# Patient Record
Sex: Male | Born: 2000
Health system: Southern US, Community
[De-identification: ages and names within clinical notes are randomized; demographics above are authoritative.]

## PROBLEM LIST (undated history)

## (undated) DIAGNOSIS — R011 Cardiac murmur, unspecified: Secondary | ICD-10-CM

## (undated) HISTORY — PX: ADENOIDECTOMY: SUR15

## (undated) HISTORY — PX: TONSILLECTOMY: SUR1361

## (undated) HISTORY — PX: WISDOM TOOTH EXTRACTION: SHX21

---
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
## 2020-02-27 ENCOUNTER — Emergency Department (HOSPITAL_BASED_OUTPATIENT_CLINIC_OR_DEPARTMENT_OTHER)
Admission: EM | Admit: 2020-02-27 | Discharge: 2020-02-27 | Disposition: A | Discharge status: Home / Self Care | Payer: 59 | Attending: Emergency Medicine | Admitting: Emergency Medicine

## 2020-02-27 ENCOUNTER — Emergency Department (HOSPITAL_BASED_OUTPATIENT_CLINIC_OR_DEPARTMENT_OTHER): Payer: 59

## 2020-02-27 ENCOUNTER — Encounter (HOSPITAL_BASED_OUTPATIENT_CLINIC_OR_DEPARTMENT_OTHER): Payer: Self-pay | Admitting: Emergency Medicine

## 2020-02-27 ENCOUNTER — Other Ambulatory Visit: Payer: Self-pay

## 2020-02-27 DIAGNOSIS — R072 Precordial pain: Secondary | ICD-10-CM | POA: Diagnosis not present

## 2020-02-27 DIAGNOSIS — M25511 Pain in right shoulder: Secondary | ICD-10-CM | POA: Diagnosis not present

## 2020-02-27 DIAGNOSIS — R0602 Shortness of breath: Secondary | ICD-10-CM | POA: Insufficient documentation

## 2020-02-27 DIAGNOSIS — M545 Low back pain: Secondary | ICD-10-CM | POA: Diagnosis not present

## 2020-02-27 DIAGNOSIS — Z87891 Personal history of nicotine dependence: Secondary | ICD-10-CM | POA: Insufficient documentation

## 2020-02-27 LAB — BASIC METABOLIC PANEL
Anion gap: 12 (ref 5–15)
BUN: 13 mg/dL (ref 6–20)
CO2: 23 mmol/L (ref 22–32)
Calcium: 9.2 mg/dL (ref 8.9–10.3)
Chloride: 102 mmol/L (ref 98–111)
Creatinine, Ser: 0.76 mg/dL (ref 0.61–1.24)
GFR calc Af Amer: >60 mL/min (ref >60)
GFR calc non Af Amer: >60 mL/min (ref >60)
Glucose, Bld: 83 mg/dL (ref 70–99)
Potassium: 3.8 mmol/L (ref 3.5–5.1)
Sodium: 137 mmol/L (ref 135–145)

## 2020-02-27 LAB — CBC
HCT: 44.9 % (ref 39.0–52.0)
Hemoglobin: 15 g/dL (ref 13.0–17.0)
MCH: 29.9 pg (ref 26.0–34.0)
MCHC: 33.4 g/dL (ref 30.0–36.0)
MCV: 89.6 fL (ref 80.0–100.0)
Platelets: 147 10*3/uL — ABNORMAL LOW (ref 150–400)
RBC: 5.01 MIL/uL (ref 4.22–5.81)
RDW: 12.5 % (ref 11.5–15.5)
WBC: 6.7 10*3/uL (ref 4.0–10.5)
nRBC: 0 % (ref 0.0–0.2)

## 2020-02-27 LAB — D-DIMER, QUANTITATIVE: D-Dimer, Quant: 0.78 ug/mL-FEU — ABNORMAL HIGH (ref 0.00–0.50)

## 2020-02-27 LAB — TROPONIN I (HIGH SENSITIVITY): Troponin I (High Sensitivity): 3 ng/L (ref <18)

## 2020-02-27 MED ORDER — IOHEXOL 350 MG/ML SOLN
100.0000 mL | Freq: Once | INTRAVENOUS | Status: AC | PRN
  Administered 2020-02-27: 100 mL via INTRAVENOUS

## 2020-02-27 MED ORDER — AZITHROMYCIN 250 MG PO TABS: 250.0000 mg | ORAL_TABLET | Freq: Every day | ORAL | 0 refills | Status: DC

## 2020-02-27 MED FILL — AZITHROMYCIN 250 MG TABLET: 250 | 5 days supply | Qty: 6 | Fill #0

## 2020-02-27 NOTE — ED Notes (Signed)
ED Provider at bedside. 

## 2020-02-27 NOTE — Discharge Instructions (Signed)
Work-up for the chest pain without any significant findings.  CT chest no signs of blood clots.  Cardiac heart marker was normal.  CT chest did raise maybe is suspicion of an early infiltrate.  So based on that we will give you a prescription for Zithromax just in case.  Do make an appointment to follow-up with cardiology.  Return for any new or worse symptoms.

## 2020-02-27 NOTE — ED Triage Notes (Addendum)
Central chest pain since yesterday. Pain is worse with inspiration. Hx of pericarditis.

## 2020-02-27 NOTE — ED Provider Notes (Signed)
[facility] EMERGENCY DEPARTMENT Provider Note   CSN: [id number] Arrival date & time: 02/27/20  1037     History Chief Complaint  Patient presents with  . Chest Pain    Paul Navarro is a 19 y.o. male.  Patient with onset of central sternal area chest pain yesterday.  Worse with inspiration.  Pain is been persistent since it started.  Patient with a past history of pericarditis seen by [facility] cardiology at that time.  Appears this was back in September 2019.  Patient also around that time was diagnosed with the community-acquired pneumonia.  So he was concerned about either 1 of these symptoms developing.  Has been associated with some shortness of breath.  Denies any fevers.  Or any upper respiratory symptoms.  No leg swelling.        Past Medical History:  Diagnosis Date  . Heart murmur     There are no problems to display for this patient.   Past Surgical History:  Procedure Laterality Date  . ADENOIDECTOMY    . TONSILLECTOMY    . WISDOM TOOTH EXTRACTION         No family history on file.  Social History   Tobacco Use  . Smoking status: Former Games developer  . Smokeless tobacco: Never Used  Vaping Use  . Vaping Use: Never used  Substance Use Topics  . Alcohol use: Yes  . Drug use: Never    Home Medications Prior to Admission medications   Medication Sig Start Date End Date Taking? Authorizing Provider  azithromycin (ZITHROMAX) 250 MG tablet Take 1 tablet (250 mg total) by mouth daily. Take first 2 tablets together, then 1 every day until finished. 02/27/20   Vanetta Mulders, MD    Allergies    Patient has no known allergies.  Review of Systems   Review of Systems  Constitutional: Negative for chills and fever.  HENT: Negative for rhinorrhea and sore throat.   Eyes: Negative for visual disturbance.  Respiratory: Positive for shortness of breath. Negative for cough.   Cardiovascular: Positive for chest pain. Negative for leg swelling.    Gastrointestinal: Negative for abdominal pain, diarrhea, nausea and vomiting.  Genitourinary: Negative for dysuria.  Musculoskeletal: Negative for back pain and neck pain.  Skin: Negative for rash.  Neurological: Negative for dizziness, light-headedness and headaches.  Hematological: Does not bruise/bleed easily.  Psychiatric/Behavioral: Negative for confusion.    Physical Exam Updated Vital Signs BP 136/71 Comment: room air  Pulse 94 Comment: room air  Temp 98.2 F (36.8 C) (Oral)   Resp (!) 22 Comment: room air  Ht 1.88 m (6\' 2" )   Wt 97.5 kg   SpO2 100% Comment: room air  BMI 27.60 kg/m   Physical Exam Vitals and nursing note reviewed.  Constitutional:      Appearance: Normal appearance. He is well-developed.  HENT:     Head: Normocephalic and atraumatic.  Eyes:     Extraocular Movements: Extraocular movements intact.     Conjunctiva/sclera: Conjunctivae normal.     Pupils: Pupils are equal, round, and reactive to light.  Cardiovascular:     Rate and Rhythm: Normal rate and regular rhythm.     Pulses: Normal pulses.     Heart sounds: Normal heart sounds. No murmur heard.   Pulmonary:     Effort: Pulmonary effort is normal. No respiratory distress.     Breath sounds: Normal breath sounds.  Abdominal:     Palpations: Abdomen is soft.  Tenderness: There is no abdominal tenderness.  Musculoskeletal:        General: No swelling. Normal range of motion.     Cervical back: Normal range of motion and neck supple.  Skin:    General: Skin is warm and dry.  Neurological:     General: No focal deficit present.     Mental Status: He is alert and oriented to person, place, and time.     Cranial Nerves: No cranial nerve deficit.     Sensory: No sensory deficit.     Motor: No weakness.     ED Results / Procedures / Treatments   Labs (all labs ordered are listed, but only abnormal results are displayed) Labs Reviewed  CBC - Abnormal; Notable for the following  components:      Result Value   Platelets 147 (*)    All other components within normal limits  D-DIMER, QUANTITATIVE (NOT AT [facility]) - Abnormal; Notable for the following components:   D-Dimer, Quant 0.78 (*)    All other components within normal limits  BASIC METABOLIC PANEL  TROPONIN I (HIGH SENSITIVITY)    EKG EKG Interpretation  Date/Time:  Thursday February 27 2020 10:43:06 EDT Ventricular Rate:  107 PR Interval:  120 QRS Duration: 88 QT Interval:  298 QTC Calculation: 397 R Axis:   60 Text Interpretation: Sinus tachycardia Nonspecific ST abnormality Abnormal ECG Confirmed by Vanetta Mulders [phone]) on 02/27/2020 11:24:16 AM   Radiology DG Chest 2 View  Result Date: 02/27/2020 CLINICAL DATA:  Chest pain. EXAM: CHEST - 2 VIEW COMPARISON:  May 21, 2018. FINDINGS: The heart size and mediastinal contours are within normal limits. Both lungs are clear. No pneumothorax or pleural effusion is noted. The visualized skeletal structures are unremarkable. IMPRESSION: No active cardiopulmonary disease. Electronically Signed   By: Lupita Raider M.D.   On: 02/27/2020 11:20   CT Angio Chest PE W/Cm &/Or Wo Cm  Result Date: 02/27/2020 CLINICAL DATA:  Chest pain. EXAM: CT ANGIOGRAPHY CHEST WITH CONTRAST TECHNIQUE: Multidetector CT imaging of the chest was performed using the standard protocol during bolus administration of intravenous contrast. Multiplanar CT image reconstructions and MIPs were obtained to evaluate the vascular anatomy. CONTRAST:  OMNIPAQUE IOHEXOL 350 MG/ML SOLN COMPARISON:  None. FINDINGS: Cardiovascular: Satisfactory opacification of the pulmonary arteries to the segmental level. No evidence of pulmonary embolism. Normal heart size. No pericardial effusion. Mediastinum/Nodes: No enlarged mediastinal, hilar, or axillary lymph nodes. Thyroid gland, trachea, and esophagus demonstrate no significant findings. Lungs/Pleura: No pneumothorax or pleural effusion is noted. Right  lung is clear. Mild left posterior basilar subsegmental atelectasis or infiltrate is noted. Upper Abdomen: No acute abnormality. Musculoskeletal: No chest wall abnormality. No acute or significant osseous findings. Review of the MIP images confirms the above findings. IMPRESSION: 1. No definite evidence of pulmonary embolus. 2. Mild left posterior basilar subsegmental atelectasis or infiltrate is noted. Electronically Signed   By: Lupita Raider M.D.   On: 02/27/2020 12:56    Procedures Procedures (including critical care time)  Medications Ordered in ED Medications  iohexol (OMNIPAQUE) 350 MG/ML injection 100 mL (100 mLs Intravenous Contrast Given 02/27/20 1214)    ED Course  I have reviewed the triage vital signs and the nursing notes.  Pertinent labs & imaging results that were available during my care of the patient were reviewed by me and considered in my medical decision making (see chart for details).    MDM Rules/Calculators/A&P  Work-up troponin normal.  Do not feel need for delta troponin since his pain started yesterday and has been constant.  Chest x-ray negative D-dimer elevated so CT angio was done.  CT angio without evidence of pulmonary embolus.  No evidence of any para or cardial fluid.  CT angio did raise concern about a basilar early infiltrate or atelectasis.  Since patient was concerned about possible pneumonia although no leukocytosis no fevers.  Will treat with a Z-Pak just in case it is an early pneumonia.  Do not think it will be harmful.  Recommend follow-up with cardiology again.  And returning for any new or worse symptoms.  EKG without evidence of ST segment elevation.  May be a hint of some septal ST segment depression.   Electrolytes without any significant abnormality. Final Clinical Impression(s) / ED Diagnoses Final diagnoses:  Precordial pain    Rx / DC Orders ED Discharge Orders         Ordered    azithromycin  (ZITHROMAX) 250 MG tablet  Daily     Discontinue  Reprint     02/27/20 1338           Vanetta Mulders, MD 02/27/20 1433

## 2020-02-28 DIAGNOSIS — R0602 Shortness of breath: Secondary | ICD-10-CM | POA: Diagnosis not present

## 2020-02-28 DIAGNOSIS — R072 Precordial pain: Secondary | ICD-10-CM | POA: Diagnosis not present

## 2020-02-28 DIAGNOSIS — M545 Low back pain: Secondary | ICD-10-CM | POA: Diagnosis not present

## 2020-02-28 DIAGNOSIS — M25511 Pain in right shoulder: Secondary | ICD-10-CM | POA: Diagnosis not present

## 2020-03-03 ENCOUNTER — Telehealth: Payer: Self-pay | Admitting: Cardiology

## 2020-03-03 NOTE — Telephone Encounter (Signed)
Pt and his Mother are calling to ask Dr. Anne Fu and his RN if they could facilitate moving his appt up to a sooner date, for post ER follow-up. Pt has an appt scheduled with Dr. Anne Fu for 7/30.  Both parties state the pt went to the ER on 7/1 and 7/2 for complaints of chest pain.  Labs were drawn and showed elevated D-DIMER.  CT Angio was then ordered and complete, and revealed NO evidence of PE. CT Angio did raise concern about basilar early infiltrate or atelectasis.  Noted by ER MD, since the pt was concerned about possible pneumonia, they treated him with a Z-PAK just incase it is an early pneumonia.  Pt also had a chest x-ray done which showed both lungs were clear and no pneumothorax or pleural effusion, other findings were within normal limits.  Pt also had an EKG done, and mentioned that may have a hint of some septal ST segment depression.   Pts Mother states they were going to do an echo on him, but were unable to obtain this appt.  Pt states he just finished his last dose of Z-PAK.  Both the pt and mother are concerned that his pericarditis could be returning, for he still gets winded on exertion, and he is having the same symptoms come on, that he once had back when he had pericarditis some time ago.  Both parties are wanting Dr. Anne Fu input on this matter, and they would like for them to move the pts appt up to a sooner date, for they state 7/30 is too long to wait.  Pt states he does have an appt with his PCP on this Thursday 7/8, for hospital follow-up.  Advised the pt to keep his appt with his PCP.  Advised him to report to the ER if symptoms persist or worsen.  Pt education provided on what warrants immediate medical attention.  Informed the pt that both Dr. Anne Fu and his RN are out of the office today, but I will route this message to them for further review and recommendation, and to follow-up with the pt thereafter.  Both the pt and his mother verbalized understanding and agrees  with this plan.

## 2020-03-03 NOTE — Telephone Encounter (Signed)
Patient's mother calling requesting the patient be seen sooner than his appointment 03/27/2020. I did not see any sooner appointments, but she would like to know if the patient can be worked in sooner.

## 2020-03-04 NOTE — Telephone Encounter (Signed)
Okay to place him on July 12 at 10:40 AM. Thanks Donato Schultz, MD

## 2020-03-04 NOTE — Telephone Encounter (Signed)
I spoke with patient and scheduled him to see Dr Anne Fu on July 12 at 10:40

## 2020-03-09 ENCOUNTER — Encounter: Payer: Self-pay | Admitting: *Deleted

## 2020-03-09 ENCOUNTER — Other Ambulatory Visit: Payer: Self-pay

## 2020-03-09 ENCOUNTER — Encounter: Payer: Self-pay | Admitting: Cardiology

## 2020-03-09 ENCOUNTER — Ambulatory Visit (INDEPENDENT_AMBULATORY_CARE_PROVIDER_SITE_OTHER): Payer: 59 | Admitting: Cardiology

## 2020-03-09 VITALS — BP 112/60 | HR 92 | Ht 74.0 in | Wt 214.0 lb

## 2020-03-09 DIAGNOSIS — R079 Chest pain, unspecified: Secondary | ICD-10-CM | POA: Diagnosis not present

## 2020-03-09 DIAGNOSIS — I309 Acute pericarditis, unspecified: Secondary | ICD-10-CM

## 2020-03-09 MED ORDER — COLCHICINE 0.6 MG PO TABS: 0.6000 mg | ORAL_TABLET | Freq: Two times a day (BID) | ORAL | 2 refills | Status: DC

## 2020-03-09 NOTE — Progress Notes (Signed)
Cardiology Office Note:    Date:  03/09/2020   ID:  Paul Navarro, DOB November 23, 2000, MRN [id number]  PCP:  Robert Bellow, PA-C  [facility] Cardiologist:  Candee Furbish, MD  [facility] Electrophysiologist:  None   Referring MD: Jefm Petty, MD     History of Present Illness:    Paul Navarro is a 19 y.o. male with recurrent chest pain of pericarditis in the past.  Was seen in the emergency department on 02/28/2020 at outside hospital.  Had a negative CTA of chest.  No evidence of pericardial effusion.  On 7/20 had sternal chest pain elevated D-dimer CT scan showed mild left posterior basilar subsegmental atelectasis/infiltrate and was treated with Z-Pak.  No pericardial fluid.  The next day came back to the ER with continued chest pain and was discharged with reassurance.  He was feeling some shortness of breath especially with bending over.  When he saw internal medicine at [facility] on 03/05/2020 he was feeling better pain decreased from 10/10 down to 1 over 2/10 in review of Paul Navarro, [place] note  Ibuprofen 800 mg 3 times a day was given for 10 days.  CRP was drawn as well as ESR.  CRP was elevated at 73 and sed rate was 69 also elevated.  Colchicine seen was added.  0.6 mg twice daily (for 10 days)  Overall he is feeling well today.  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  Colchicine needed a preauthorization.    Past Medical History:  Diagnosis Date  . Heart murmur     Past Surgical History:  Procedure Laterality Date  . ADENOIDECTOMY    . TONSILLECTOMY    . WISDOM TOOTH EXTRACTION      Current Medications: Current Meds  Medication Sig  . colchicine 0.6 MG tablet Take 1 tablet (0.6 mg total) by mouth 2 (two) times daily. Take 1 tablet twice a day for 3 months  .  [DISCONTINUED] colchicine 0.6 MG tablet Take 0.6 mg by mouth daily.     Allergies:   Patient has no known allergies.   Social History   Socioeconomic History  . Marital status: Single    Spouse name: Not on file  . Number of children: Not on file  . Years of education: Not on file  . Highest education level: Not on file  Occupational History  . Not on file  Tobacco Use  . Smoking status: Former Research scientist (life sciences)  . Smokeless tobacco: Never Used  Vaping Use  . Vaping Use: Never used  Substance and Sexual Activity  . Alcohol use: Yes  . Drug use: Never  . Sexual activity: Not on file  Other Topics Concern  . Not on file  Social History Narrative  . Not on file   Social Determinants of Health   Financial Resource Strain:   . Difficulty of Paying Living Expenses:   Food Insecurity:   . Worried About Charity fundraiser in the Last Year:   . Arboriculturist in the Last Year:   Transportation Needs:   . Film/video editor (Medical):   Marland Kitchen Lack of Transportation (Non-Medical):   Physical Activity:   . Days of Exercise per Week:   . Minutes of Exercise per Session:   Stress:   .  Feeling of Stress :   Social Connections:   . Frequency of Communication with Friends and Family:   . Frequency of Social Gatherings with Friends and Family:   . Attends Religious Services:   . Active Member of Clubs or Organizations:   . Attends Archivist Meetings:   Marland Kitchen Marital Status:      Family History: The patient's father has had "blood clot in his heart "is on blood thinner, also needed a vein in his leg removed and placed in his heart "-likely bypass surgery.  ROS:   Please see the history of present illness.     All other systems reviewed and are negative.  EKGs/Labs/Other Studies Reviewed:     EKG: EKG from 02/28/2020 reviewed shows sinus rhythm with nonspecific ST-T wave changes  Recent Labs: 02/27/2020: BUN 13; Creatinine, Ser 0.76; Hemoglobin 15.0; Platelets 147; Potassium  3.8; Sodium 137  Recent Lipid Panel No results found for: CHOL, TRIG, HDL, CHOLHDL, VLDL, LDLCALC, LDLDIRECT  Physical Exam:    VS:  BP 112/60   Pulse 92   Ht '6\' 2"'$  (1.88 m)   Wt 214 lb (97.1 kg)   SpO2 99%   BMI 27.48 kg/m     Wt Readings from Last 3 Encounters:  03/09/20 214 lb (97.1 kg) (96 %, Z= 1.73)*  02/27/20 215 lb (97.5 kg) (96 %, Z= 1.76)*  06/25/18 202 lb (91.6 kg) (95 %, Z= 1.62)*   * Growth percentiles are based on CDC (Boys, 2-20 Years) data.     GEN:  Well nourished, well developed in no acute distress HEENT: Normal NECK: No JVD; No carotid bruits LYMPHATICS: No lymphadenopathy CARDIAC: RRR, no murmurs, rubs, gallops RESPIRATORY:  Clear to auscultation without rales, wheezing or rhonchi  ABDOMEN: Soft, non-tender, non-distended MUSCULOSKELETAL:  No edema; No deformity  SKIN: Warm and dry NEUROLOGIC:  Alert and oriented x 3 PSYCHIATRIC:  Normal affect   ASSESSMENT:    1. Acute pericarditis, unspecified type   2. Chest pain of uncertain etiology    PLAN:    In order of problems listed above:  Second episode of pericarditis.  Last episode was about 2 years ago. We will give him colchicine for 3 months total. Spoke to them about Arcalyst, Rilonacept which can be used for recurrent pericarditis.  New medication.  He is feeling better now.  Inflammatory markers, CRP were elevated. 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  If he is feeling well in 2 weeks, he may resume this activity. I would hold off on any heavy exertional activity at this time. ANA has been drawn by primary doc.  Father has had clot.  Hopefully there is no evidence of hypercoagulable state.  I am checking echocardiogram.  Lets make sure that there is no decrease in function.  Medication Adjustments/Labs and Tests Ordered: Current medicines are reviewed at length with the patient today.  Concerns regarding medicines are outlined above.  Orders Placed This  Encounter  Procedures  . ECHOCARDIOGRAM COMPLETE   Meds ordered this encounter  Medications  . colchicine 0.6 MG tablet    Sig: Take 1 tablet (0.6 mg total) by mouth 2 (two) times daily. Take 1 tablet twice a day for 3 months    Dispense:  60 tablet    Refill:  2    Patient Instructions  Medication Instructions:  Please take Colchicine 0.6 (1) tablet twice a day for 3 months. Continue all other medications as listed.  *  If you need a refill on your cardiac medications before your next appointment, please call your pharmacy*  Testing/Procedures: Your physician has requested that you have an echocardiogram. Echocardiography is a painless test that uses sound waves to create images of your heart. It provides your doctor with information about the size and shape of your heart and how well your heart's chambers and valves are working. This procedure takes approximately one hour. There are no restrictions for this procedure.  Follow-Up: At [facility], you and your health needs are our priority.  As part of our continuing mission to provide you with exceptional heart care, we have created designated Provider Care Teams.  These Care Teams include your primary Cardiologist (physician) and Advanced Practice Providers (APPs -  Physician Assistants and Nurse Practitioners) who all work together to provide you with the care you need, when you need it.  We recommend signing up for the patient portal called "MyChart".  Sign up information is provided on this After Visit Summary.  MyChart is used to connect with patients for Virtual Visits (Telemedicine).  Patients are able to view lab/test results, encounter notes, upcoming appointments, etc.  Non-urgent messages can be sent to your provider as well.   To learn more about what you can do with MyChart, go to [email or web address].    Your next appointment:   2 month(s)  The format for your next appointment:   In Person  Provider:   You may  see one of the following Advanced Practice Providers on your designated Care Team:    Truitt Merle, NP  Cecilie Kicks, NP  Kathyrn Drown, NP   Thank you for choosing [facility]!!         Signed, Candee Furbish, MD  03/09/2020 11:39 AM    [place]

## 2020-03-09 NOTE — Patient Instructions (Addendum)
Medication Instructions:  Please take Colchicine 0.6 (1) tablet twice a day for 3 months. Continue all other medications as listed.  *If you need a refill on your cardiac medications before your next appointment, please call your pharmacy*  Testing/Procedures: Your physician has requested that you have an echocardiogram. Echocardiography is a painless test that uses sound waves to create images of your heart. It provides your doctor with information about the size and shape of your heart and how well your heart's chambers and valves are working. This procedure takes approximately one hour. There are no restrictions for this procedure.  Follow-Up: At [facility], you and your health needs are our priority.  As part of our continuing mission to provide you with exceptional heart care, we have created designated Provider Care Teams.  These Care Teams include your primary Cardiologist (physician) and Advanced Practice Providers (APPs -  Physician Assistants and Nurse Practitioners) who all work together to provide you with the care you need, when you need it.  We recommend signing up for the patient portal called "MyChart".  Sign up information is provided on this After Visit Summary.  MyChart is used to connect with patients for Virtual Visits (Telemedicine).  Patients are able to view lab/test results, encounter notes, upcoming appointments, etc.  Non-urgent messages can be sent to your provider as well.   To learn more about what you can do with MyChart, go to [email or web address].    Your next appointment:   2 month(s)  The format for your next appointment:   In Person  Provider:   You may see one of the following Advanced Practice Providers on your designated Care Team:    Norma Fredrickson, NP  Nada Boozer, NP  Georgie Chard, NP   Thank you for choosing [facility]!!

## 2020-03-18 MED ORDER — COLCHICINE 0.6 MG PO CAPS: 0.6000 mg | ORAL_CAPSULE | Freq: Two times a day (BID) | ORAL | 0 refills | Status: AC

## 2020-03-18 MED ORDER — COLCHICINE 0.6 MG PO TABS: 0.6000 mg | ORAL_TABLET | Freq: Two times a day (BID) | ORAL | 2 refills | Status: DC

## 2020-03-18 NOTE — Addendum Note (Signed)
Addended by: Sharin Grave on: 03/18/2020 02:29 PM   Modules accepted: Orders

## 2020-03-24 ENCOUNTER — Ambulatory Visit (HOSPITAL_COMMUNITY): Payer: 59 | Attending: Cardiology

## 2020-03-24 ENCOUNTER — Other Ambulatory Visit: Payer: Self-pay

## 2020-03-24 DIAGNOSIS — I309 Acute pericarditis, unspecified: Secondary | ICD-10-CM

## 2020-03-24 LAB — ECHOCARDIOGRAM COMPLETE
Area-P 1/2: 3.51 cm2
S' Lateral: 2.6 cm

## 2020-03-25 ENCOUNTER — Telehealth: Payer: Self-pay | Admitting: Cardiology

## 2020-03-25 NOTE — Telephone Encounter (Signed)
Transferred call to Ann 

## 2020-03-27 ENCOUNTER — Ambulatory Visit: Payer: 59 | Admitting: Cardiology

## 2020-04-05 IMAGING — CT CT ABD-PELV W/ CM
2 of 4 series · 16 of 46 positions shown, 18 images · IV contrast (APPLIED)
Comparison: None.

CLINICAL DATA: 16-year-old male with acute RIGHT abdominal and
pelvic pain with fever for 1 day.

EXAM:
CT ABDOMEN AND PELVIS WITH CONTRAST
TECHNIQUE: Multidetector CT imaging of the abdomen and pelvis was performed
using the standard protocol following bolus administration of
intravenous contrast.
CONTRAST:  100mL A6OXMP-DPP IOPAMIDOL (A6OXMP-DPP) INJECTION 61%

[Series 2: axial st · axial · 0.80mm/px · z∈[+693,+1208]mm · 13 of 113 slices shown, 15 images]
[im 5/113  soft-tissue]
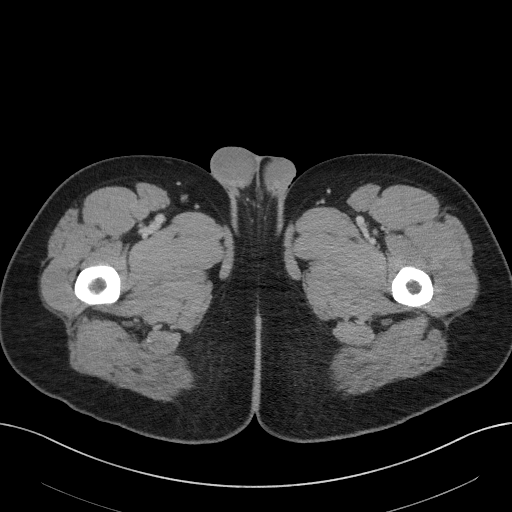
[im 5/113  bone]
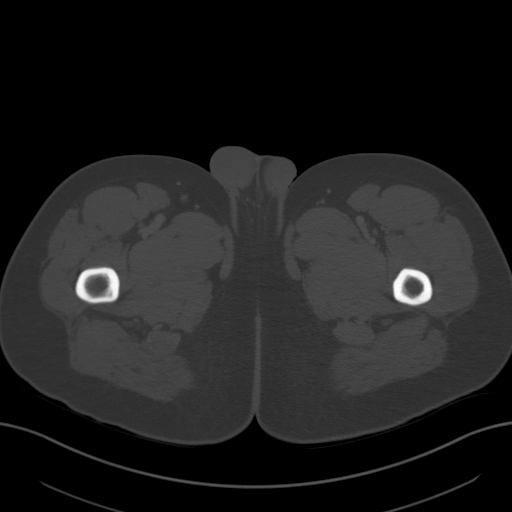
[im 14/113  soft-tissue]
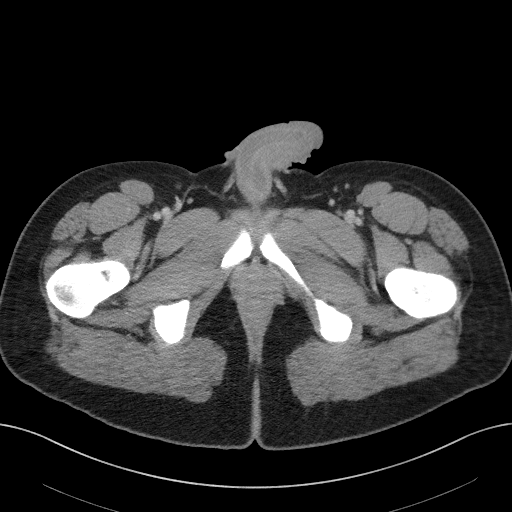
[im 23/113  soft-tissue]
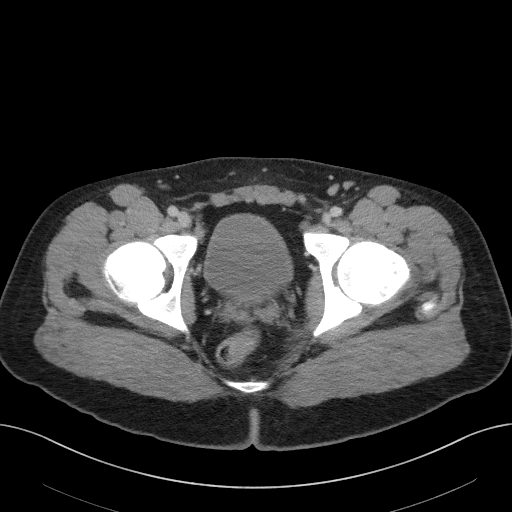
[im 32/113  soft-tissue]
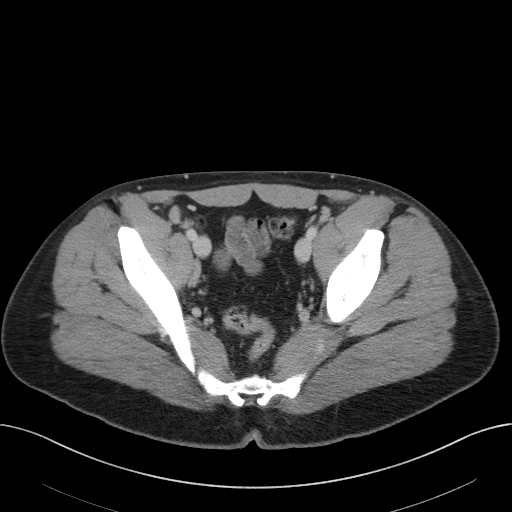
[im 41/113  soft-tissue]
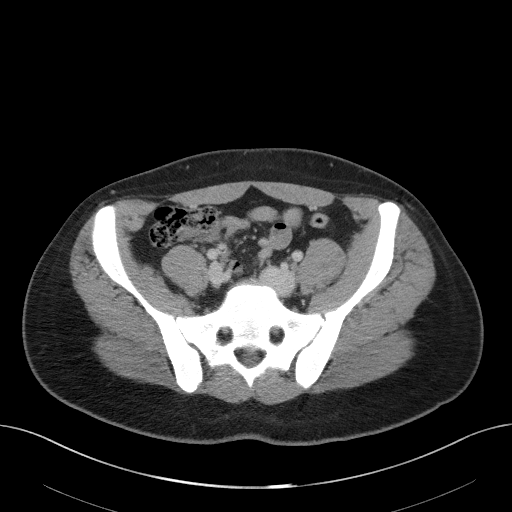
[im 50/113  soft-tissue]
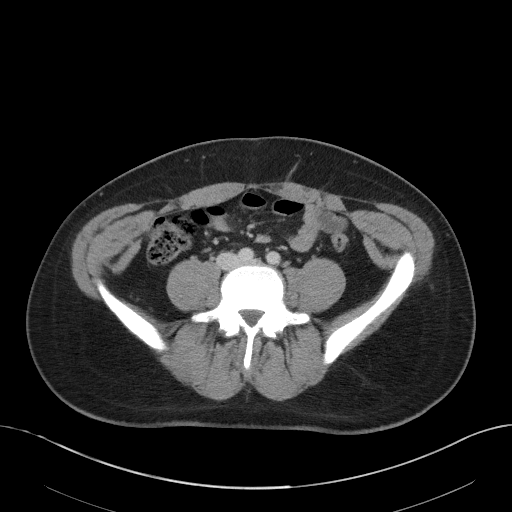
[im 59/113  soft-tissue]
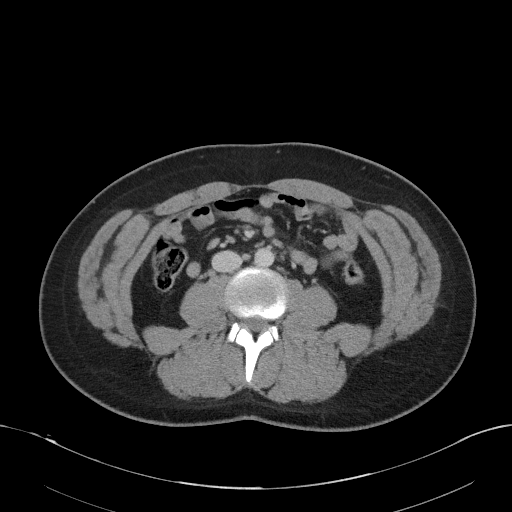
[im 63/113  soft-tissue]
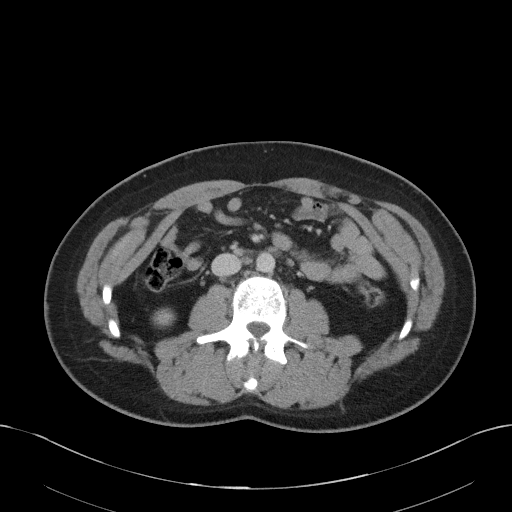
[im 72/113  soft-tissue]
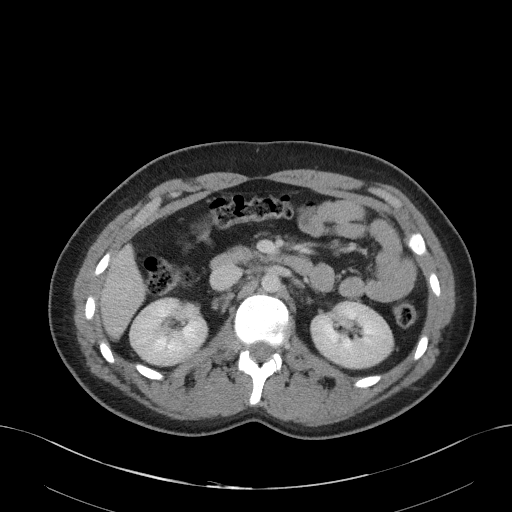
[im 72/113  bone]
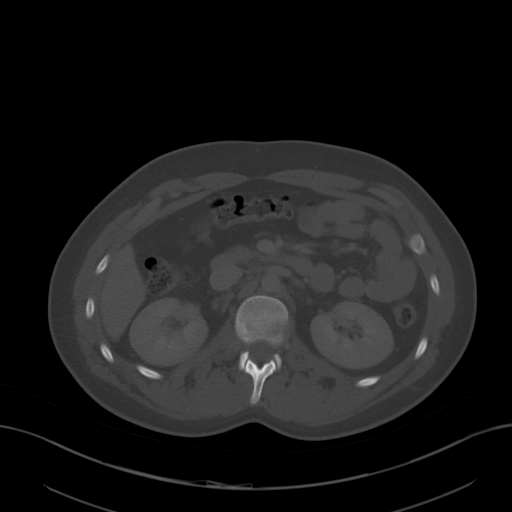
[im 81/113  soft-tissue]
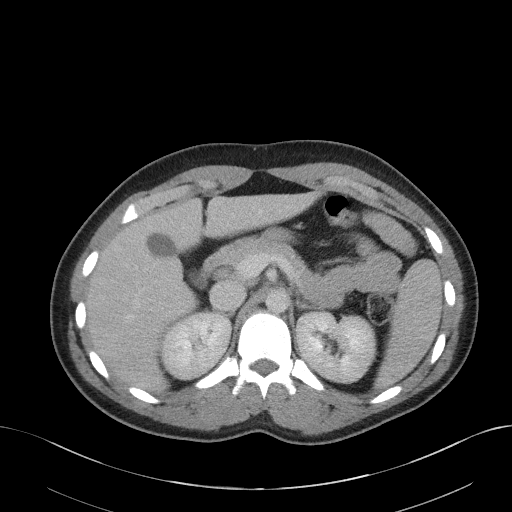
[im 90/113  soft-tissue]
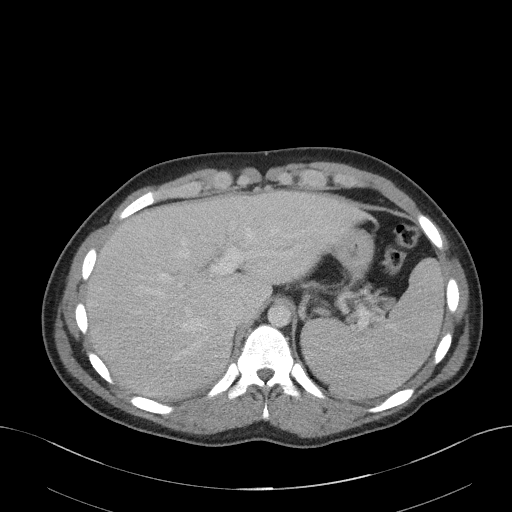
[im 99/113  soft-tissue]
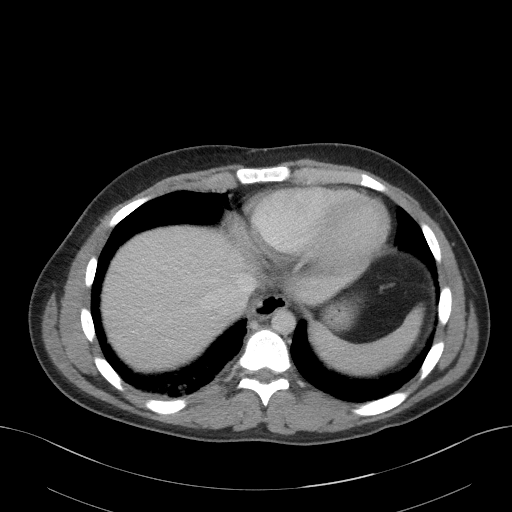
[im 108/113  soft-tissue]
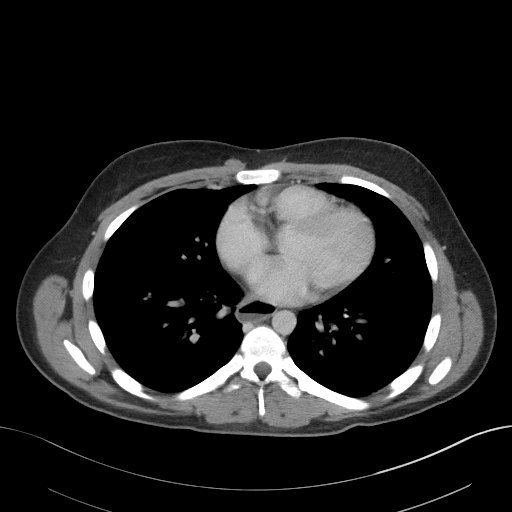

[Series 5: coronal st · coronal · 0.80mm/px · 3 of 76 slices shown]
[im 26/76  soft-tissue]
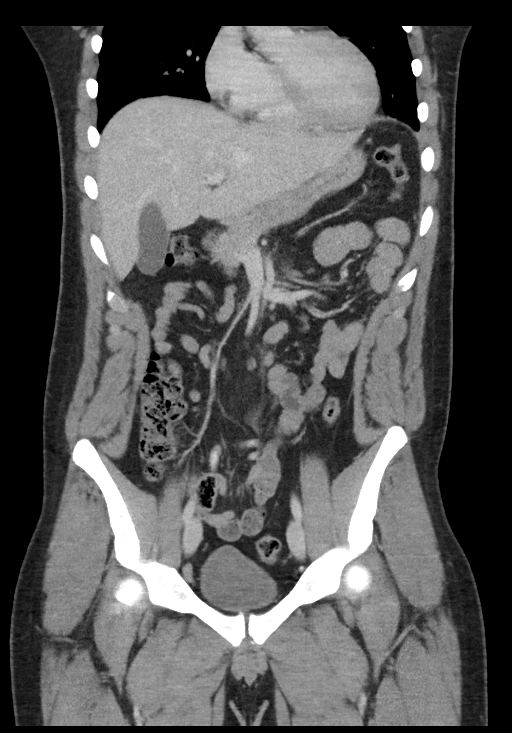
[im 34/76  soft-tissue]
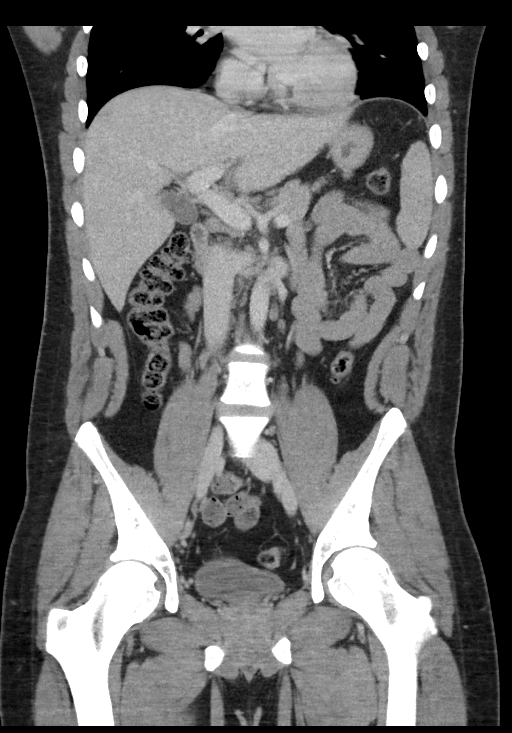
[im 42/76  soft-tissue]
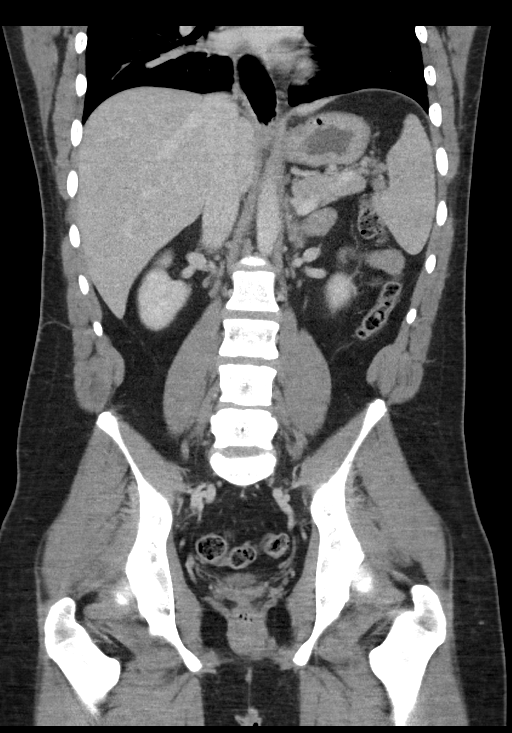

[16 of 46 positions shown; findings below may reference images not displayed]

FINDINGS: Lower chest: Mild posterior RIGHT LOWER lobe airspace disease likely
represents pneumonia.

Hepatobiliary: The liver and gallbladder are unremarkable. No
biliary dilatation.

Pancreas: Unremarkable

Spleen: Unremarkable

Adrenals/Urinary Tract: The kidneys, adrenal glands and bladder are
unremarkable.

Stomach/Bowel: Stomach is within normal limits. Appendix appears
normal. No evidence of bowel wall thickening, distention, or
inflammatory changes.

Vascular/Lymphatic: Shotty retroperitoneal lymph nodes are likely
reactive. No vascular abnormalities identified.

Reproductive: Prostate is unremarkable.

Other: No ascites, abscess, pneumoperitoneum or abdominal wall
hernia.

Musculoskeletal: No acute or suspicious bony abnormalities.
IMPRESSION: 1. Mild RIGHT LOWER lobe airspace disease likely representing
pneumonia.
2. Shotty retroperitoneal lymph nodes-likely reactive.
3. No other significant abnormalities.  Normal appendix.

## 2020-04-06 IMAGING — CR DG CHEST 2V
2 series · 2 of 2 positions shown · non-contrast
Comparison: None.

CLINICAL DATA: Shortness of breath and fever

EXAM:
CHEST - 2 VIEW

[w chest pa]
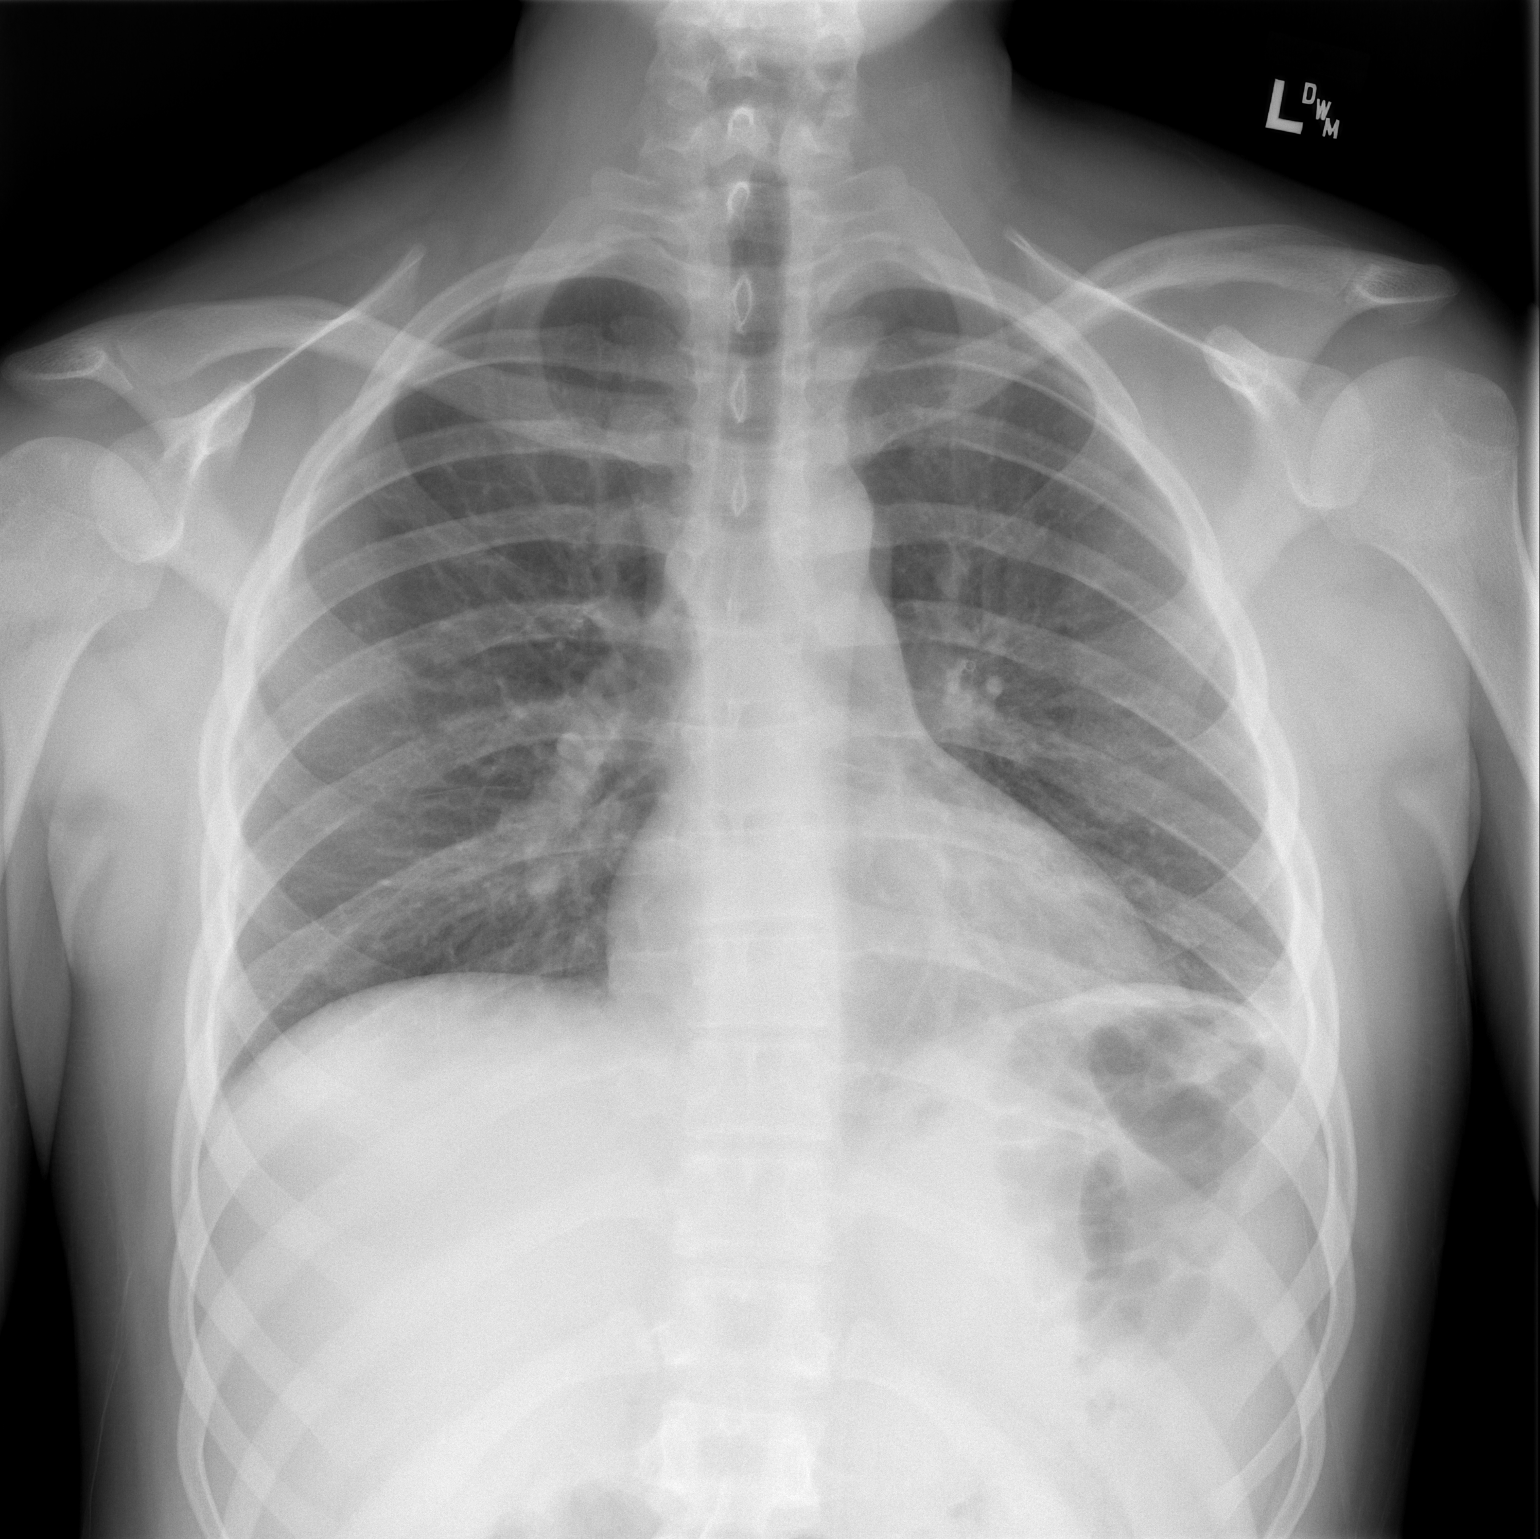

[w chest lat]
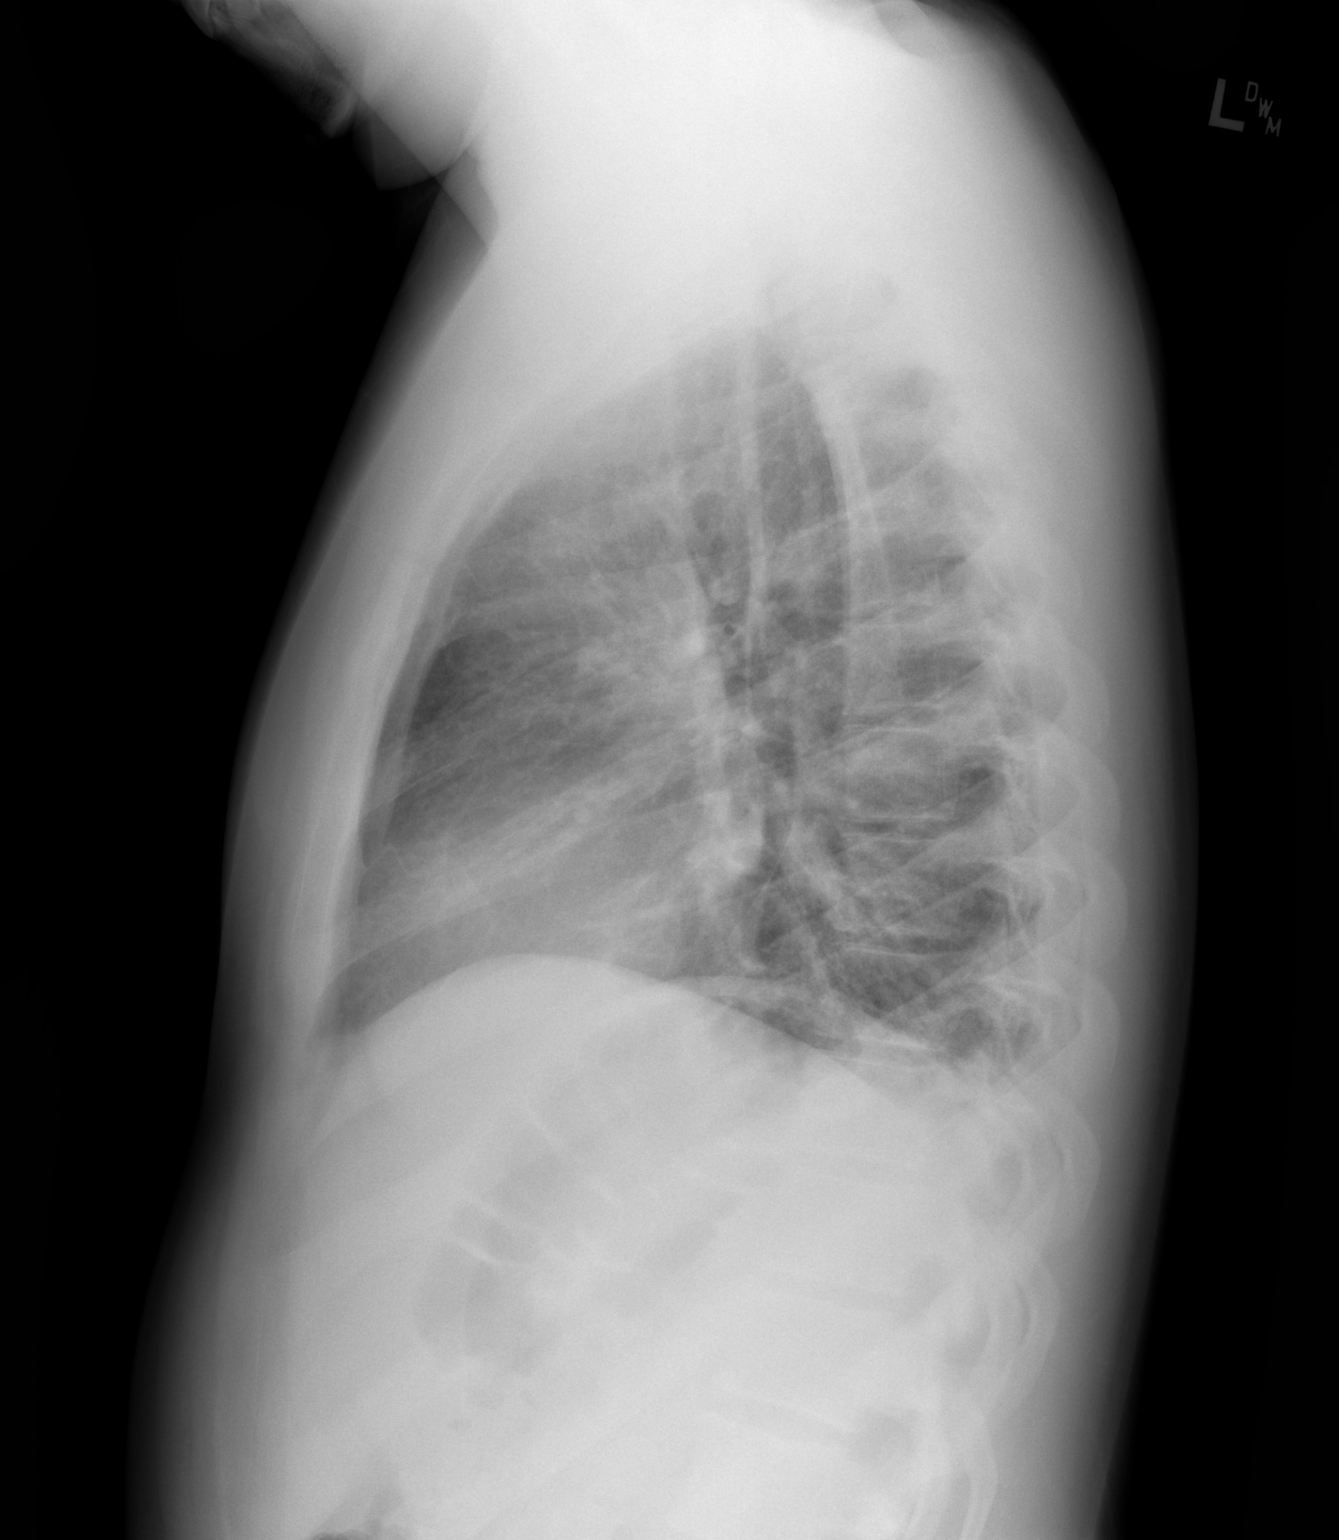

[2 of 2 positions shown; findings below may reference images not displayed]

FINDINGS: There is slight atelectatic change in the left base. The lungs
elsewhere are clear. Heart size and pulmonary vascularity are
normal. No adenopathy. No bone lesions.
IMPRESSION: Slight left base atelectasis.  No edema or consolidation.

## 2020-05-25 ENCOUNTER — Ambulatory Visit: Payer: 59 | Admitting: Nurse Practitioner

## 2020-05-28 ENCOUNTER — Ambulatory Visit (INDEPENDENT_AMBULATORY_CARE_PROVIDER_SITE_OTHER): Payer: 59 | Admitting: Cardiology

## 2020-05-28 ENCOUNTER — Other Ambulatory Visit: Payer: Self-pay

## 2020-05-28 ENCOUNTER — Encounter: Payer: Self-pay | Admitting: Cardiology

## 2020-05-28 VITALS — BP 100/60 | HR 86 | Ht 74.0 in | Wt 216.0 lb

## 2020-05-28 DIAGNOSIS — I309 Acute pericarditis, unspecified: Secondary | ICD-10-CM

## 2020-05-28 NOTE — Progress Notes (Signed)
Cardiology Office Note:    Date:  05/28/2020   ID:  Paul Navarro, DOB 2001/03/08, MRN [id number]  PCP:  Robert Bellow, PA-C  [facility] Cardiologist:  Candee Furbish, MD  [facility] Electrophysiologist:  None   Referring MD: Robert Bellow, PA-C     History of Present Illness:    Paul Navarro is a 19 y.o. male here for the follow-up of pericarditis. No pericardial effusion.  Negative CT of chest.  Previous note:  On 7/20 had sternal chest pain elevated D-dimer CT scan showed mild left posterior basilar subsegmental atelectasis/infiltrate and was treated with Z-Pak.  No pericardial fluid.  The next day came back to the ER with continued chest pain and was discharged with reassurance.  He was feeling some shortness of breath especially with bending over.  When he saw internal medicine at [facility] on 03/05/2020 he was feeling better pain decreased from 10/10 down to 1 over 2/10 in review of Tami Ribas, [place] note  Ibuprofen 800 mg 3 times a day was given for 10 days.  CRP was drawn as well as ESR.  CRP was elevated at 73 and sed rate was 69 also elevated.  Colchicine seen was added.  0.6 mg twice daily (for 10 days)  Echocardiogram subsequently was reassuring.  He has been playing football and is about to play soccer.  Doing well without any issues.  No syncope no chest pain.  No shortness of breath.   Past Medical History:  Diagnosis Date  . Heart murmur     Past Surgical History:  Procedure Laterality Date  . ADENOIDECTOMY    . TONSILLECTOMY    . WISDOM TOOTH EXTRACTION      Current Medications: Current Meds  Medication Sig  . amLODipine (NORVASC) 5 MG tablet Take 5 mg by mouth daily.  . Colchicine (MITIGARE) 0.6 MG CAPS Take 0.6 mg by mouth in the morning and at bedtime.     Allergies:   Patient has no known allergies.   Social History   Socioeconomic History  . Marital status: Single    Spouse name: Not on file  . Number of children: Not on file  .  Years of education: Not on file  . Highest education level: Not on file  Occupational History  . Not on file  Tobacco Use  . Smoking status: Former Research scientist (life sciences)  . Smokeless tobacco: Never Used  Vaping Use  . Vaping Use: Never used  Substance and Sexual Activity  . Alcohol use: Yes  . Drug use: Never  . Sexual activity: Not on file  Other Topics Concern  . Not on file  Social History Narrative  . Not on file   Social Determinants of Health   Financial Resource Strain:   . Difficulty of Paying Living Expenses: Not on file  Food Insecurity:   . Worried About Charity fundraiser in the Last Year: Not on file  . Ran Out of Food in the Last Year: Not on file  Transportation Needs:   . Lack of Transportation (Medical): Not on file  . Lack of Transportation (Non-Medical): Not on file  Physical Activity:   . Days of Exercise per Week: Not on file  . Minutes of Exercise per Session: Not on file  Stress:   . Feeling of Stress : Not on file  Social Connections:   . Frequency of Communication with Friends and Family: Not on file  . Frequency of Social Gatherings with Friends and  Family: Not on file  . Attends Religious Services: Not on file  . Active Member of Clubs or Organizations: Not on file  . Attends Banker Meetings: Not on file  . Marital Status: Not on file     ROS:   Please see the history of present illness.     All other systems reviewed and are negative.  EKGs/Labs/Other Studies Reviewed:    The following studies were reviewed today:  ECHO 03/24/20:  1. Left ventricular ejection fraction, by estimation, is 55 to 60%. The  left ventricle has normal function. The left ventricle has no regional  wall motion abnormalities. Left ventricular diastolic parameters were  normal.  2. Right ventricular systolic function is normal. The right ventricular  size is normal. Tricuspid regurgitation signal is inadequate for assessing  PA pressure.  3. The mitral  valve is normal in structure. No evidence of mitral valve  regurgitation. No evidence of mitral stenosis.  4. The aortic valve is tricuspid. Aortic valve regurgitation is not  visualized. No aortic stenosis is present.  5. The inferior vena cava is normal in size with greater than 50%  respiratory variability, suggesting right atrial pressure of 3 mmHg.    Recent Labs: 02/27/2020: BUN 13; Creatinine, Ser 0.76; Hemoglobin 15.0; Platelets 147; Potassium 3.8; Sodium 137  Recent Lipid Panel No results found for: CHOL, TRIG, HDL, CHOLHDL, VLDL, LDLCALC, LDLDIRECT  Physical Exam:    VS:  BP 100/60   Pulse 86   Ht 6\' 2"  (1.88 m)   Wt 216 lb (98 kg)   SpO2 99%   BMI 27.73 kg/m     Wt Readings from Last 3 Encounters:  05/28/20 216 lb (98 kg) (96 %, Z= 1.75)*  03/09/20 214 lb (97.1 kg) (96 %, Z= 1.73)*  02/27/20 215 lb (97.5 kg) (96 %, Z= 1.76)*   * Growth percentiles are based on CDC (Boys, 2-20 Years) data.     GEN:  Well nourished, well developed in no acute distress HEENT: Normal NECK: No JVD; No carotid bruits LYMPHATICS: No lymphadenopathy CARDIAC: RRR, no murmurs, rubs, gallops RESPIRATORY:  Clear to auscultation without rales, wheezing or rhonchi  ABDOMEN: Soft, non-tender, non-distended MUSCULOSKELETAL:  No edema; No deformity  SKIN: Warm and dry NEUROLOGIC:  Alert and oriented x 3 PSYCHIATRIC:  Normal affect   ASSESSMENT:    1. Acute pericarditis, unspecified type    PLAN:    In order of problems listed above:  Recurrent pericarditis -Had an episode about 2 years ago, recently in July. Gave him colchicine  Spoke about Arcalyst, Rilonacept which can be used for recurrent pericarditis.  New medication. Inflammatory markers, CRP were elevated previously.  Possible autoimmune condition -Seen by rheumatology.  ANA positive. -Fingertips turning purple, placed on amlodipine 5 mg.  Doing well with this therapy. -There is some thoughts that he may potentially have  lupus however since he is not symptomatic at the time challenging to make the diagnosis.  Continuing to follow-up with rheumatology.    Medication Adjustments/Labs and Tests Ordered: Current medicines are reviewed at length with the patient today.  Concerns regarding medicines are outlined above.  No orders of the defined types were placed in this encounter.  No orders of the defined types were placed in this encounter.   Patient Instructions  Medication Instructions:  The current medical regimen is effective;  continue present plan and medications.  *If you need a refill on your cardiac medications before your next appointment, please call  your pharmacy*  Follow-Up: At [facility], you and your health needs are our priority.  As part of our continuing mission to provide you with exceptional heart care, we have created designated Provider Care Teams.  These Care Teams include your primary Cardiologist (physician) and Advanced Practice Providers (APPs -  Physician Assistants and Nurse Practitioners) who all work together to provide you with the care you need, when you need it.  We recommend signing up for the patient portal called "MyChart".  Sign up information is provided on this After Visit Summary.  MyChart is used to connect with patients for Virtual Visits (Telemedicine).  Patients are able to view lab/test results, encounter notes, upcoming appointments, etc.  Non-urgent messages can be sent to your provider as well.   To learn more about what you can do with MyChart, go to [email or web address].    Your next appointment:   12 month(s)  The format for your next appointment:   In Person  Provider:   Candee Furbish, MD   Thank you for choosing [facility]!!        Signed, Candee Furbish, MD  05/28/2020 3:05 PM    [place]

## 2020-05-28 NOTE — Patient Instructions (Signed)
Medication Instructions:  The current medical regimen is effective;  continue present plan and medications.  *If you need a refill on your cardiac medications before your next appointment, please call your pharmacy*  Follow-Up: At [facility], you and your health needs are our priority.  As part of our continuing mission to provide you with exceptional heart care, we have created designated Provider Care Teams.  These Care Teams include your primary Cardiologist (physician) and Advanced Practice Providers (APPs -  Physician Assistants and Nurse Practitioners) who all work together to provide you with the care you need, when you need it.  We recommend signing up for the patient portal called "MyChart".  Sign up information is provided on this After Visit Summary.  MyChart is used to connect with patients for Virtual Visits (Telemedicine).  Patients are able to view lab/test results, encounter notes, upcoming appointments, etc.  Non-urgent messages can be sent to your provider as well.   To learn more about what you can do with MyChart, go to [email or web address].    Your next appointment:   12 month(s)  The format for your next appointment:   In Person  Provider:   Mark Skains, MD   Thank you for choosing [facility]!!      

## 2020-10-01 DIAGNOSIS — M2550 Pain in unspecified joint: Secondary | ICD-10-CM | POA: Diagnosis not present

## 2020-10-01 DIAGNOSIS — Z0000 Encounter for general adult medical examination without abnormal findings: Secondary | ICD-10-CM | POA: Diagnosis not present

## 2020-10-01 DIAGNOSIS — I7300 Raynaud's syndrome without gangrene: Secondary | ICD-10-CM | POA: Diagnosis not present

## 2020-12-07 DIAGNOSIS — I7300 Raynaud's syndrome without gangrene: Secondary | ICD-10-CM | POA: Diagnosis not present

## 2020-12-07 DIAGNOSIS — I319 Disease of pericardium, unspecified: Secondary | ICD-10-CM | POA: Diagnosis not present

## 2020-12-07 DIAGNOSIS — M329 Systemic lupus erythematosus, unspecified: Secondary | ICD-10-CM | POA: Diagnosis not present

## 2020-12-07 DIAGNOSIS — Z79899 Other long term (current) drug therapy: Secondary | ICD-10-CM | POA: Diagnosis not present

## 2021-06-07 DIAGNOSIS — I7300 Raynaud's syndrome without gangrene: Secondary | ICD-10-CM | POA: Diagnosis not present

## 2021-06-07 DIAGNOSIS — Z79899 Other long term (current) drug therapy: Secondary | ICD-10-CM | POA: Diagnosis not present

## 2021-06-07 DIAGNOSIS — M329 Systemic lupus erythematosus, unspecified: Secondary | ICD-10-CM | POA: Diagnosis not present

## 2021-11-16 DIAGNOSIS — M328 Other forms of systemic lupus erythematosus: Secondary | ICD-10-CM | POA: Diagnosis not present

## 2021-11-16 DIAGNOSIS — Z79899 Other long term (current) drug therapy: Secondary | ICD-10-CM | POA: Diagnosis not present

## 2022-05-19 DIAGNOSIS — M328 Other forms of systemic lupus erythematosus: Secondary | ICD-10-CM | POA: Diagnosis not present

## 2022-05-19 DIAGNOSIS — Z79899 Other long term (current) drug therapy: Secondary | ICD-10-CM | POA: Diagnosis not present

## 2022-05-19 DIAGNOSIS — H52223 Regular astigmatism, bilateral: Secondary | ICD-10-CM | POA: Diagnosis not present

## 2022-09-07 DIAGNOSIS — S43431A Superior glenoid labrum lesion of right shoulder, initial encounter: Secondary | ICD-10-CM | POA: Diagnosis not present

## 2022-09-27 DIAGNOSIS — S43431A Superior glenoid labrum lesion of right shoulder, initial encounter: Secondary | ICD-10-CM | POA: Diagnosis not present
